# Patient Record
Sex: Male | Born: 2000 | Race: White | Hispanic: No | Marital: Single | Smoking: Never smoker
Health system: Southern US, Community
[De-identification: ages and names within clinical notes are randomized; demographics above are authoritative.]

## PROBLEM LIST (undated history)

## (undated) DIAGNOSIS — R519 Headache, unspecified: Secondary | ICD-10-CM

## (undated) DIAGNOSIS — R51 Headache: Secondary | ICD-10-CM

## (undated) DIAGNOSIS — J45909 Unspecified asthma, uncomplicated: Secondary | ICD-10-CM

## (undated) HISTORY — DX: Headache, unspecified: R51.9

## (undated) HISTORY — DX: Headache: R51

## (undated) HISTORY — PX: CIRCUMCISION: SUR203

---
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
## 2020-07-05 IMAGING — US US ABDOMEN LIMITED
1 series · 10 of 10 positions shown · non-contrast
Comparison: None.

CLINICAL DATA: 17-year-old male with right lower quadrant abdominal
pain.

EXAM:
ULTRASOUND ABDOMEN LIMITED
TECHNIQUE: Gray scale imaging of the right lower quadrant was performed to
evaluate for suspected appendicitis. Standard imaging planes and
graded compression technique were utilized.

[Series 1: us abdomen limited · 10 acquisitions, 10 frames shown]
[im 1/10]
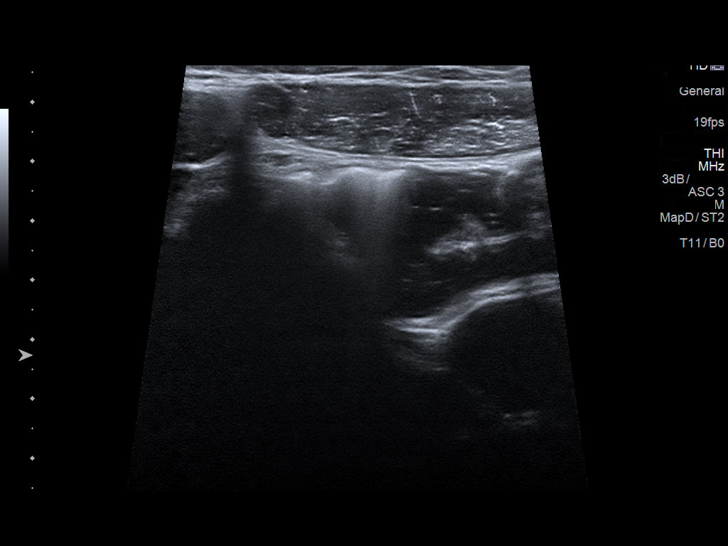
[im 2/10]
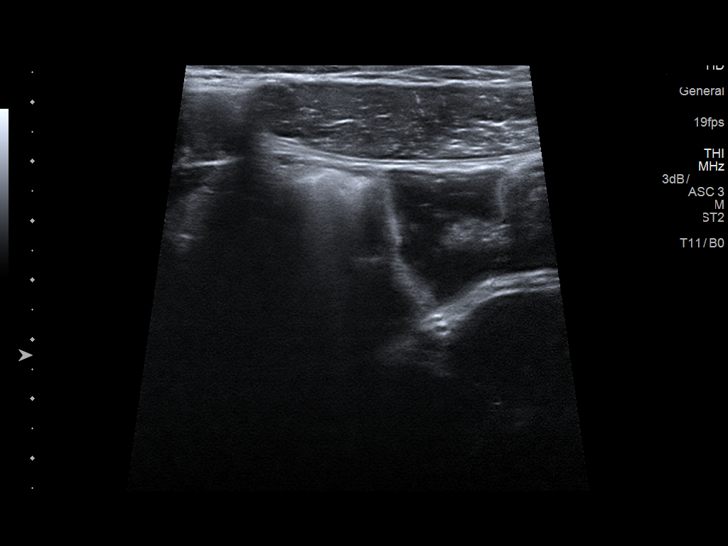
[im 3/10]
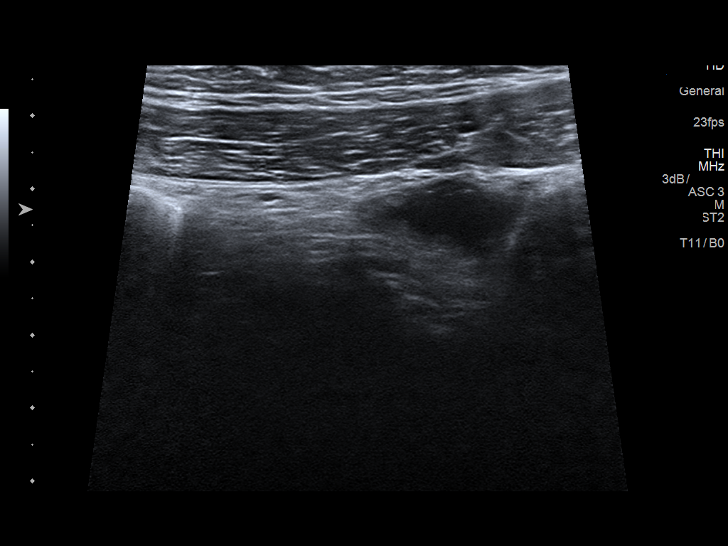
[im 4/10]
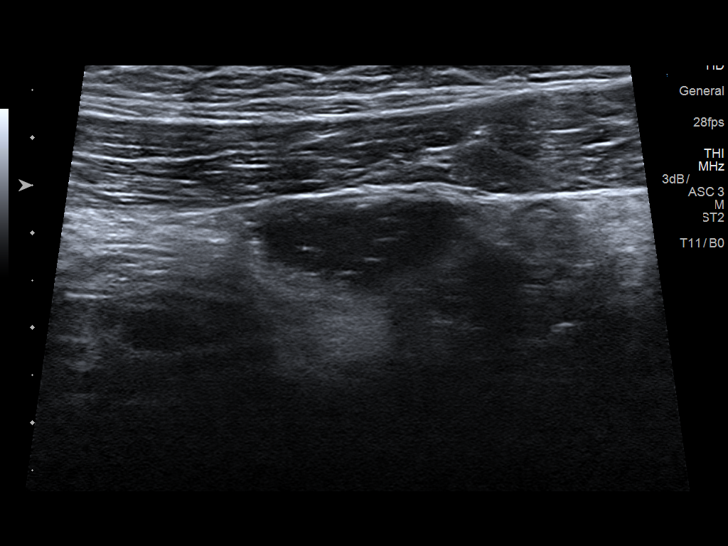
[im 5/10]
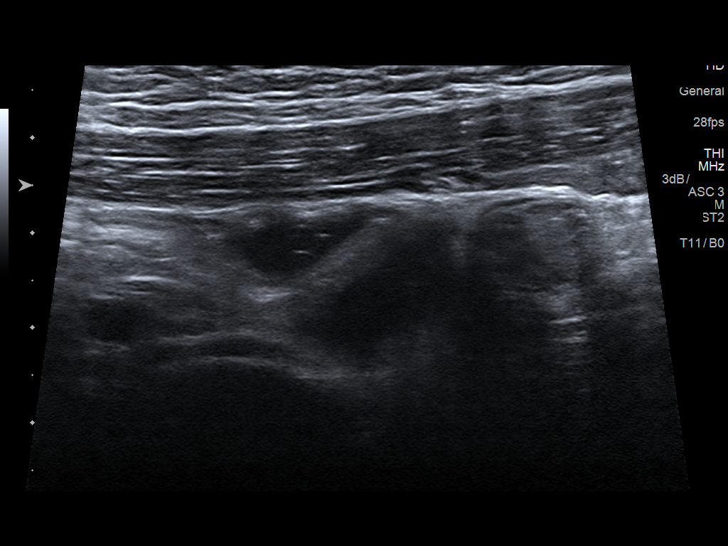
[im 6/10]
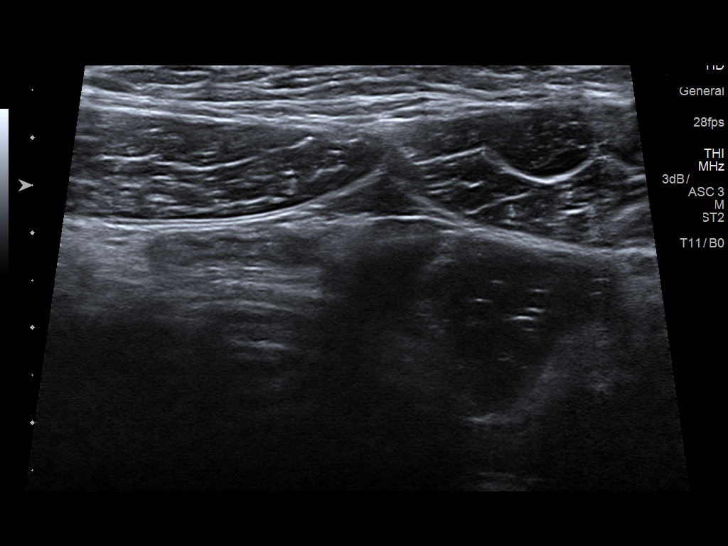
[im 7/10]
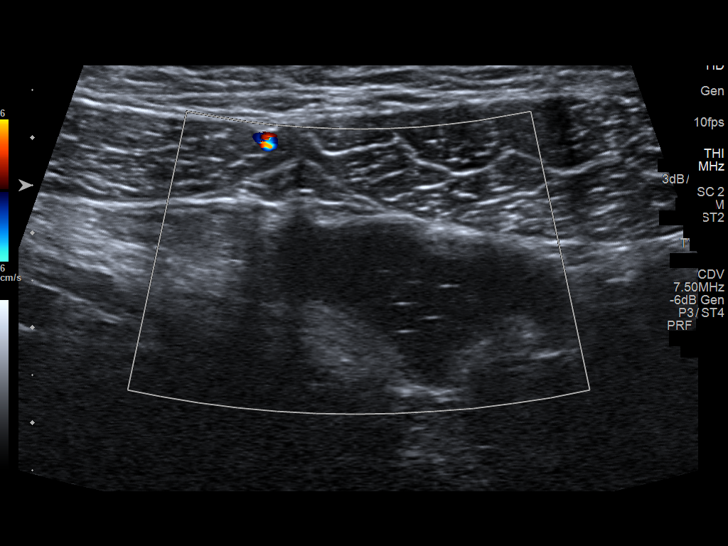
[im 8/10]
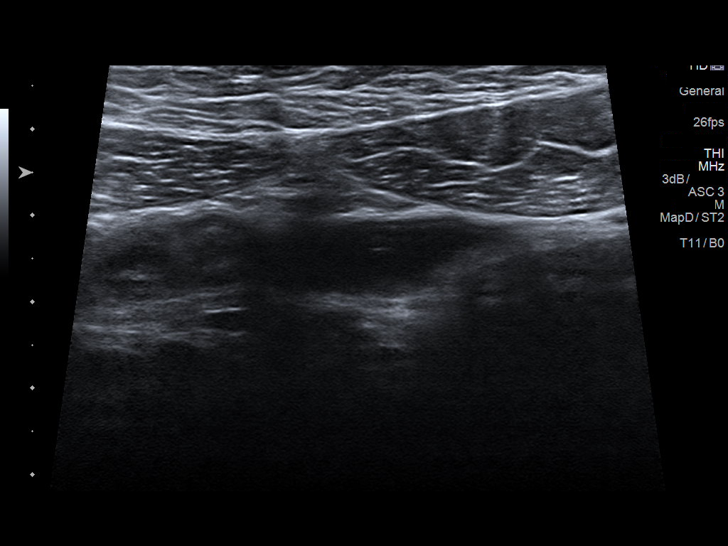
[im 9/10]
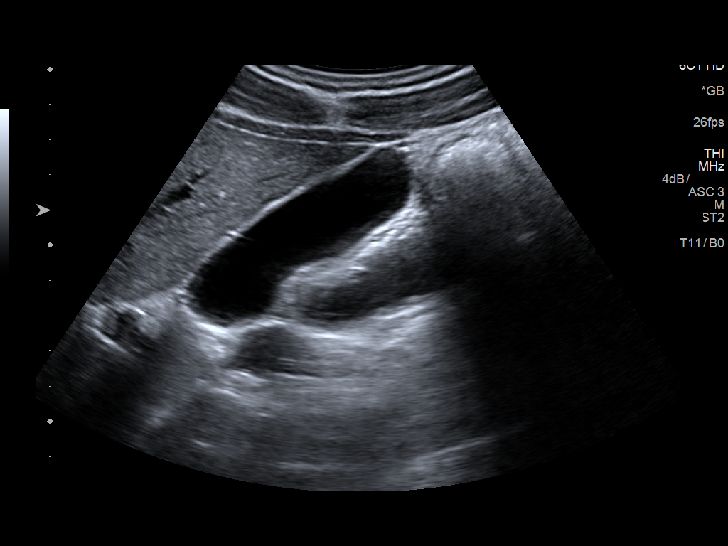
[im 10/10]
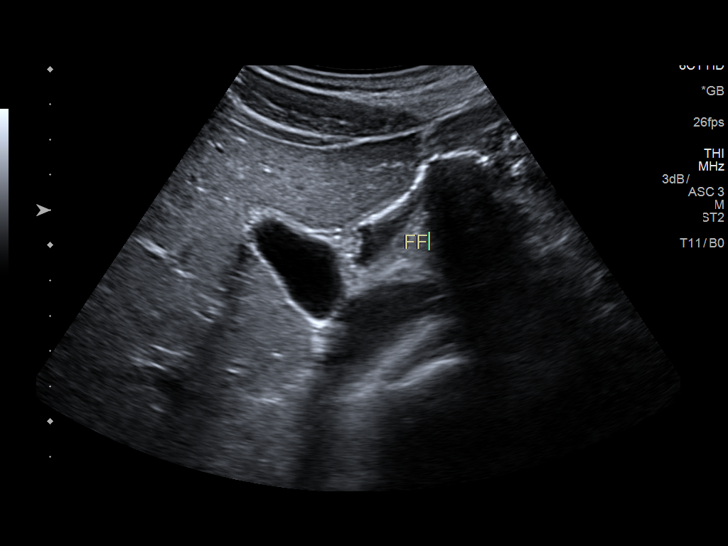

[10 of 10 positions shown; findings below may reference images not displayed]

FINDINGS: The appendix is not visualized.

Ancillary findings: Suspected right lower quadrant free fluid.

Factors affecting image quality: None.

Other findings: Trace free fluid in the right upper quadrant near
the gallbladder (image 11).
IMPRESSION: 1. Positive for small volume free fluid in the right abdomen.
Recommend CT Abdomen and Pelvis (oral and IV contrast preferred).
2. Appendix not identified.

Note: Non-visualization of appendix by US does not definitely
exclude appendicitis. If there is sufficient clinical concern,
consider abdomen pelvis CT with contrast for further evaluation.

## 2022-04-16 ENCOUNTER — Other Ambulatory Visit: Payer: Self-pay
